# Patient Record
Sex: Male | Born: 1975 | Hispanic: Yes | Marital: Married | Smoking: Never smoker
Health system: Southern US, Community
[De-identification: ages and names within clinical notes are randomized; demographics above are authoritative.]

---

## 2017-04-27 ENCOUNTER — Emergency Department
Admission: EM | Admit: 2017-04-27 | Discharge: 2017-04-27 | Disposition: A | Discharge status: Home / Self Care | Payer: Self-pay | Attending: Student in an Organized Health Care Education/Training Program | Admitting: Student in an Organized Health Care Education/Training Program

## 2017-04-27 ENCOUNTER — Emergency Department: Payer: Self-pay

## 2017-04-27 ENCOUNTER — Encounter: Payer: Self-pay | Admitting: Emergency Medicine

## 2017-04-27 DIAGNOSIS — T148XXA Other injury of unspecified body region, initial encounter: Secondary | ICD-10-CM

## 2017-04-27 DIAGNOSIS — W450XXA Nail entering through skin, initial encounter: Secondary | ICD-10-CM | POA: Insufficient documentation

## 2017-04-27 DIAGNOSIS — Y929 Unspecified place or not applicable: Secondary | ICD-10-CM | POA: Insufficient documentation

## 2017-04-27 DIAGNOSIS — Y998 Other external cause status: Secondary | ICD-10-CM | POA: Insufficient documentation

## 2017-04-27 DIAGNOSIS — Y9389 Activity, other specified: Secondary | ICD-10-CM | POA: Insufficient documentation

## 2017-04-27 DIAGNOSIS — S91332A Puncture wound without foreign body, left foot, initial encounter: Secondary | ICD-10-CM | POA: Insufficient documentation

## 2017-04-27 MED ORDER — IBUPROFEN 600 MG PO TABS: 600.0000 mg | ORAL_TABLET | Freq: Four times a day (QID) | ORAL | 0 refills | Status: AC | PRN

## 2017-04-27 MED ORDER — LEVOFLOXACIN 500 MG PO TABS: 500.0000 mg | ORAL_TABLET | Freq: Every day | ORAL | 0 refills | Status: AC

## 2017-04-27 MED ORDER — OXYCODONE-ACETAMINOPHEN 5-325 MG PO TABS
1.0000 | ORAL_TABLET | Freq: Once | ORAL | Status: AC
  Administered 2017-04-27: 1 via ORAL
  Filled 2017-04-27: qty 1

## 2017-04-27 NOTE — ED Notes (Signed)
Pt able to walk well with crutches. Pt ambulatory to wheelchair using crutches for discharge. Pt and significant other verbalized understanding of discharge instructions, prescriptions and follow-up care. Pt also verbalized understanding importance of following-up with manager re: worker's comp since we were unable to contact his employer. Pt significant other drove pt home so we did not have to hold him d/t administration of pain medication.

## 2017-04-27 NOTE — ED Triage Notes (Signed)
Patient to ER with c/o pain to left foot after stepping on nail today at approx 1300.

## 2017-04-27 NOTE — ED Notes (Signed)
Portable XR at bedside

## 2017-04-27 NOTE — ED Notes (Addendum)
Pt c/o pain in the center of the ball of his left foot s/p stepping on a nail around noon today. Bottom of left foot is red and swollen. Reports last tetanus shot was 4 years ago but not entirely sure. Pt states this occurred at work and would like to claim worker's comp.

## 2017-04-27 NOTE — ED Notes (Signed)
Unable to contact pt employer

## 2017-04-27 NOTE — ED Provider Notes (Signed)
[facility] Emergency Department Provider Note  ____________________________________________  Time seen: Approximately 11:01 PM  I have reviewed the triage vital signs and the nursing notes.   HISTORY  Chief Complaint Foot Pain    HPI Jonathan Rojas is a 41 y.o. male that presents to emergency department for evaluation of foot pain after stepping on a nail today. No additional injuries. Last tetanus shot was 4 years ago. No alleviating measures have been attempted. No numbness, tingling.   History reviewed. No pertinent past medical history.  There are no active problems to display for this patient.   History reviewed. No pertinent surgical history.  Prior to Admission medications   Medication Sig Start Date End Date Taking? Authorizing Provider  ibuprofen (ADVIL,MOTRIN) 600 MG tablet Take 1 tablet (600 mg total) by mouth every 6 (six) hours as needed. 04/27/17   Enid Derry, PA-C  levofloxacin (LEVAQUIN) 500 MG tablet Take 1 tablet (500 mg total) by mouth daily. 04/27/17 05/07/17  Enid Derry, PA-C    Allergies Patient has no known allergies.  No family history on file.  Social History Social History  Substance Use Topics  . Smoking status: Never Smoker  . Smokeless tobacco: Never Used  . Alcohol use No     Review of Systems  Constitutional: No fever/chills Cardiovascular: No chest pain. Respiratory: No SOB. Gastrointestinal: No abdominal pain.  No nausea, no vomiting.  Musculoskeletal: Positive for foot pain. Skin: Negative for rash, ecchymosis. Neurological: Negative for headaches, numbness or tingling   ____________________________________________   PHYSICAL EXAM:  VITAL SIGNS: ED Triage Vitals  Enc Vitals Group     BP 04/27/17 2045 (!) 109/95     Pulse Rate 04/27/17 2045 97     Resp 04/27/17 2045 18     Temp 04/27/17 2045 98.1 F (36.7 C)     Temp Source 04/27/17 2045 Oral     SpO2 04/27/17 2045 100 %     Weight  04/27/17 2045 180 lb (81.6 kg)     Height 04/27/17 2045 5\' 6"  (1.676 m)     Head Circumference --      Peak Flow --      Pain Score 04/27/17 2044 10     Pain Loc --      Pain Edu? --      Excl. in GC? --      Constitutional: Alert and oriented. Well appearing and in no acute distress. Eyes: Conjunctivae are normal. PERRL. EOMI. Head: Atraumatic. ENT:      Ears:      Nose: No congestion/rhinnorhea.      Mouth/Throat: Mucous membranes are moist.  Neck: No stridor.  Cardiovascular: Normal rate, regular rhythm.  Good peripheral circulation. Respiratory: Normal respiratory effort without tachypnea or retractions. Lungs CTAB. Good air entry to the bases with no decreased or absent breath sounds. Musculoskeletal: Full range of motion to all extremities. No gross deformities appreciated. Neurologic:  Normal speech and language. No gross focal neurologic deficits are appreciated.  Skin:  Skin is warm, dry. Puncture wound to bottom of left foot with mild surrounding erythema.   ____________________________________________   LABS (all labs ordered are listed, but only abnormal results are displayed)  Labs Reviewed - No data to display ____________________________________________  EKG   ____________________________________________  RADIOLOGY Lexine Baton, personally viewed and evaluated these images (plain radiographs) as part of my medical decision making, as well as reviewing the written report by the radiologist.  Dg Foot Complete Left  Result Date: 04/27/2017 CLINICAL DATA:  Left foot pain after stepping on a nail at 1300 hours. Pain is at the base of the third digit. EXAM: LEFT FOOT - COMPLETE 3+ VIEW COMPARISON:  None. FINDINGS: There is no evidence of fracture or dislocation. No radiopaque foreign body. No appreciated subcutaneous emphysema. There is no evidence of arthropathy or other focal bone abnormality. IMPRESSION: Negative for fracture, retained foreign body or  significant soft tissue swelling. Electronically Signed   By: Tollie Ethavid  Kwon M.D.   On: 04/27/2017 21:19    ____________________________________________    PROCEDURES  Procedure(s) performed:    Procedures    Medications  oxyCODONE-acetaminophen (PERCOCET/ROXICET) 5-325 MG per tablet 1 tablet (not administered)     ____________________________________________   INITIAL IMPRESSION / ASSESSMENT AND PLAN / ED COURSE  Pertinent labs & imaging results that were available during my care of the patient were reviewed by me and considered in my medical decision making (see chart for details).  Review of the [place] CSRS was performed in accordance of the [facility] prior to dispensing any controlled drugs.     Patient presented to the emergency department for evaluation of puncture wound. Vital signs and exam are reassuring. Patient soaked foot in saline and iodine with in ED. X-ray negative for acute bony abnormalities. Crutches were given. Patient will be discharged home with prescriptions for Levaquin and ibuprofen. Patient is to follow up with primary care as directed. Patient is given ED precautions to return to the ED for any worsening or new symptoms.     ____________________________________________  FINAL CLINICAL IMPRESSION(S) / ED DIAGNOSES  Final diagnoses:  Puncture wound      NEW MEDICATIONS STARTED DURING THIS VISIT:  New Prescriptions   IBUPROFEN (ADVIL,MOTRIN) 600 MG TABLET    Take 1 tablet (600 mg total) by mouth every 6 (six) hours as needed.   LEVOFLOXACIN (LEVAQUIN) 500 MG TABLET    Take 1 tablet (500 mg total) by mouth daily.        This chart was dictated using voice recognition software/Dragon. Despite best efforts to proofread, errors can occur which can change the meaning. Any change was purely unintentional.    Enid DerryWagner, Jakari Jacot, PA-C 04/27/17 2337    Willy Eddyobinson, Patrick, MD 04/27/17 [phone]

## 2019-01-01 IMAGING — DX DG FOOT COMPLETE 3+V*L*
3 series · 3 of 3 positions shown · non-contrast
Comparison: None.

CLINICAL DATA: Left foot pain after stepping on a nail at 9944
hours. Pain is at the base of the third digit.

EXAM:
LEFT FOOT - COMPLETE 3+ VIEW

[foot ap]
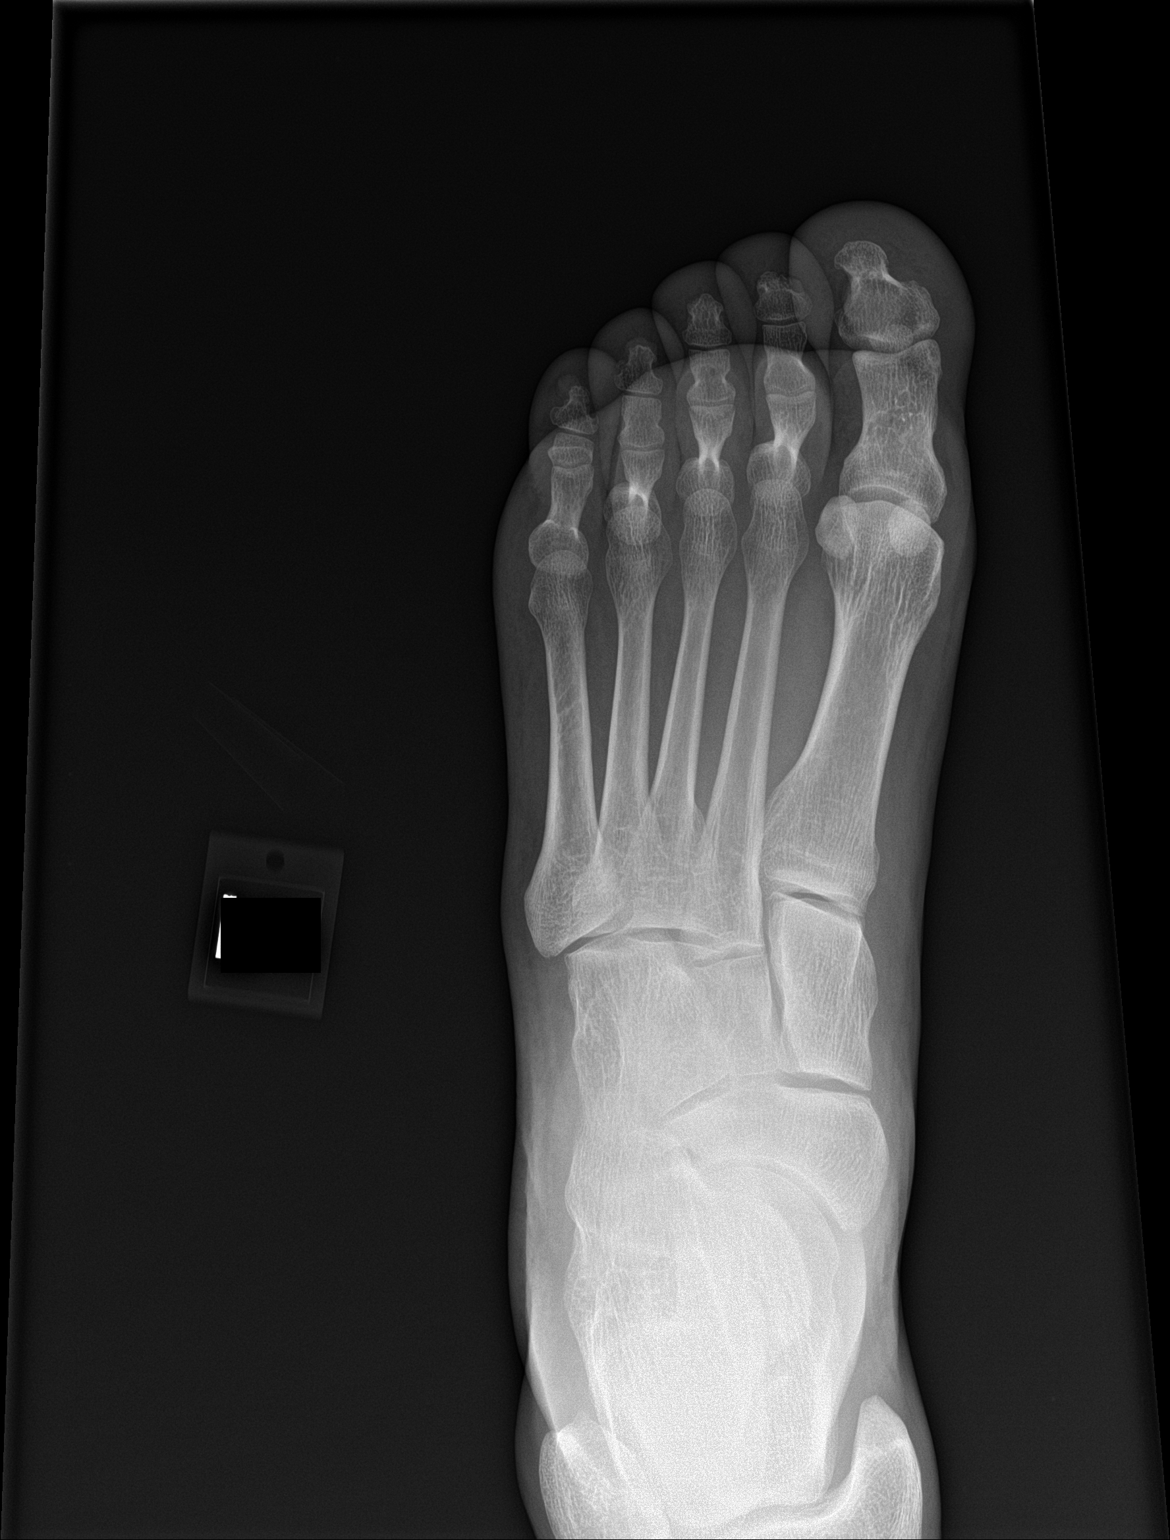

[foot obl]
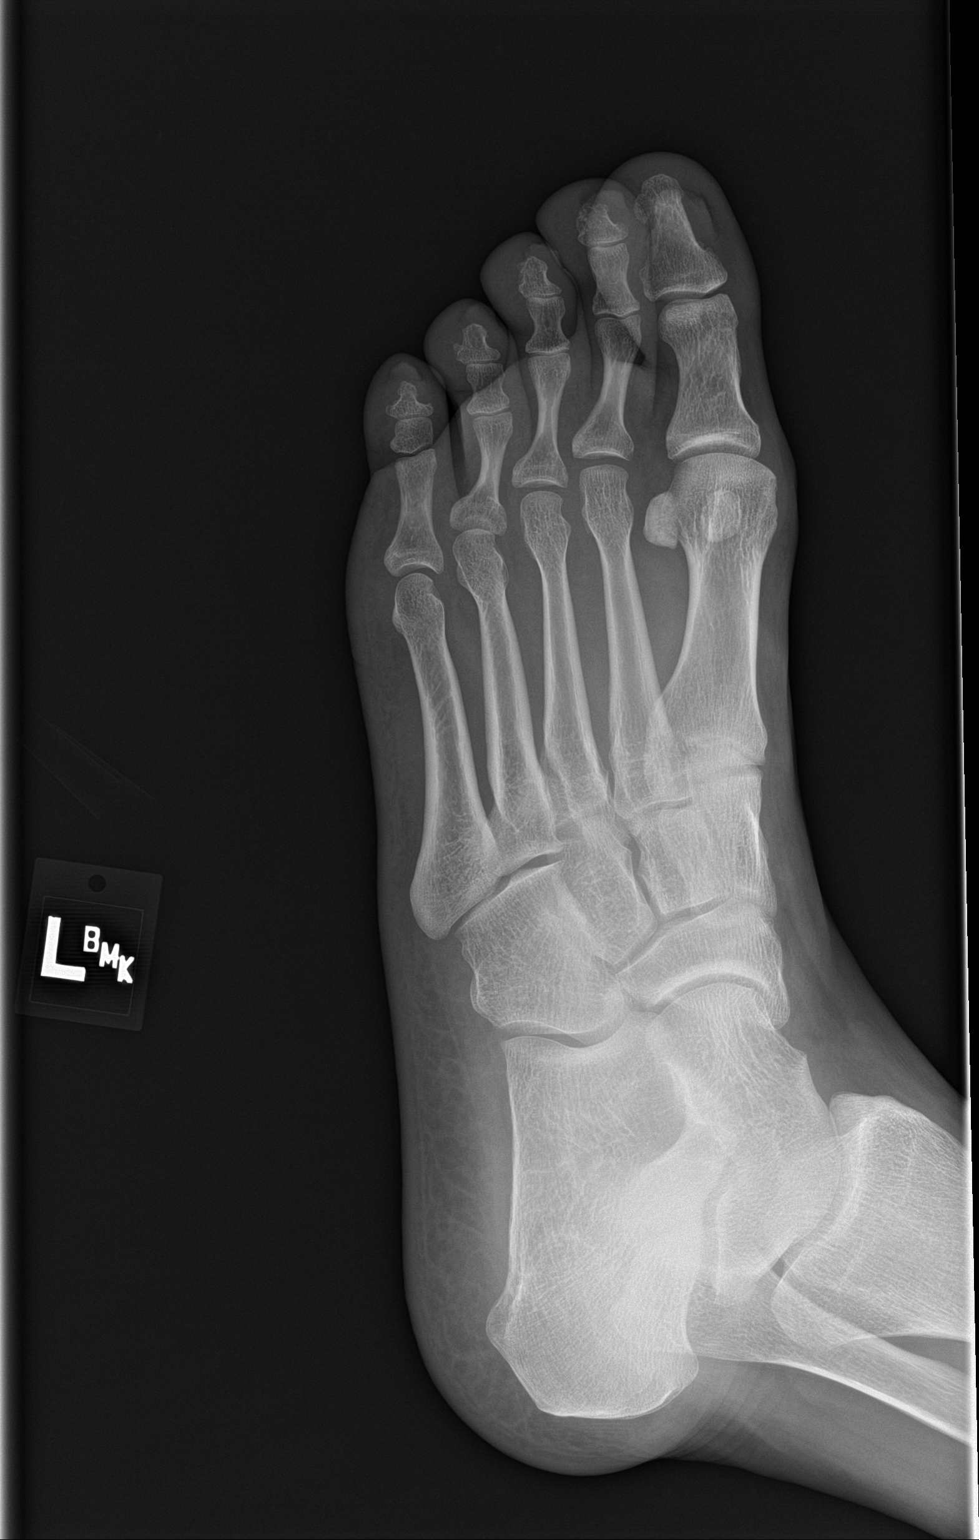

[foot lat]
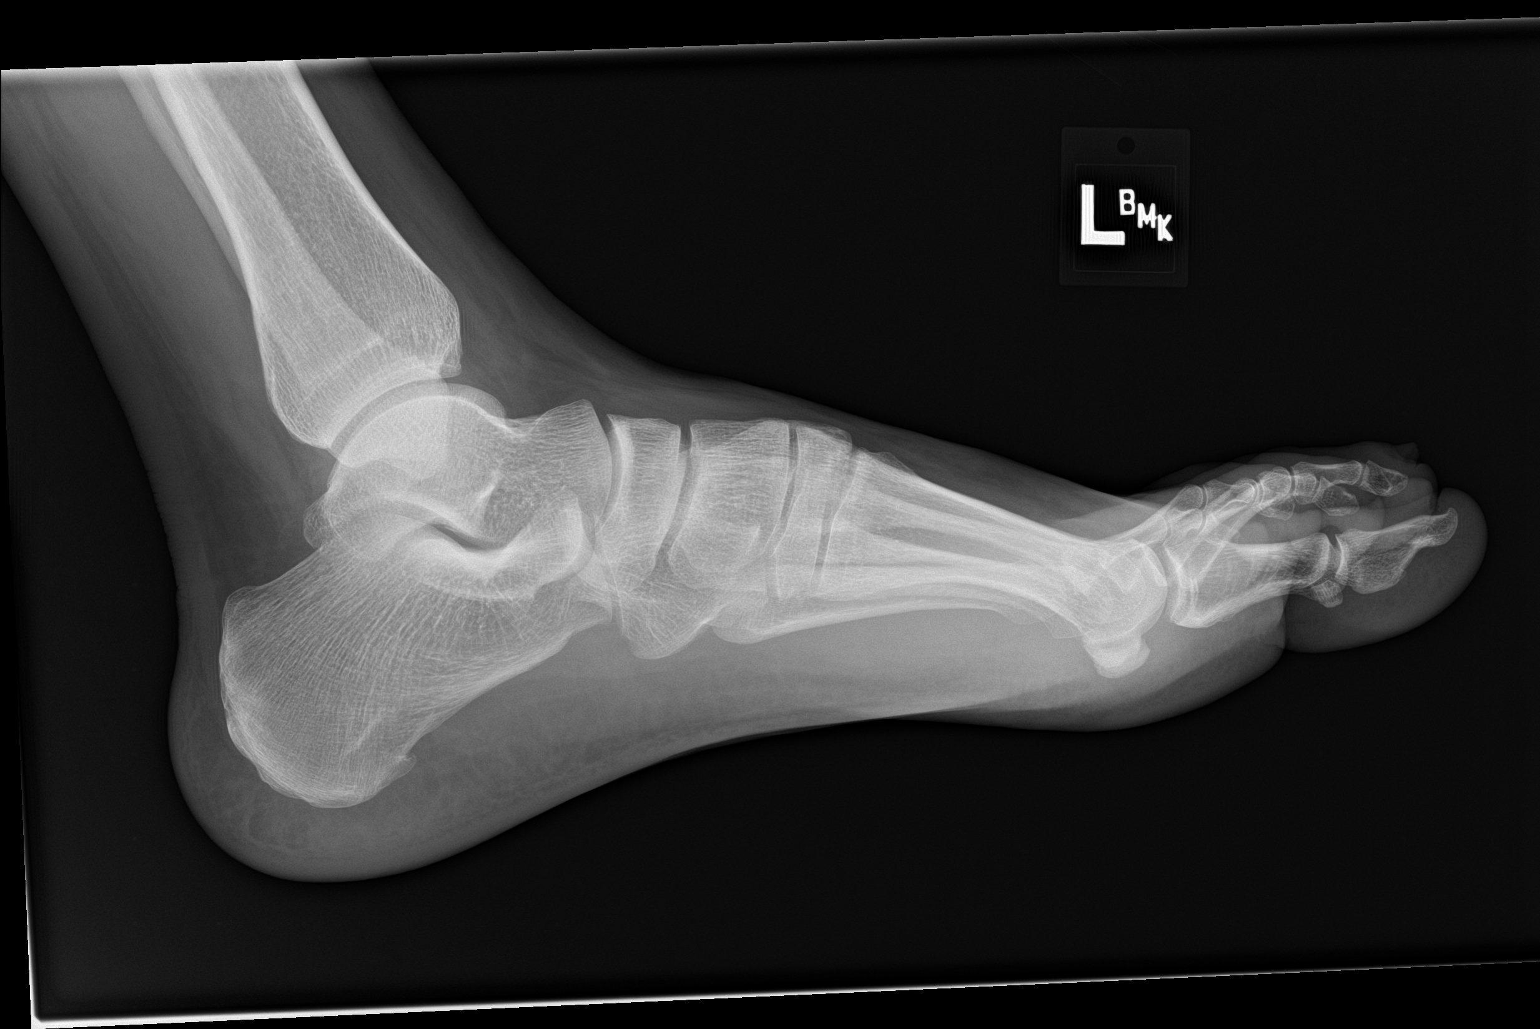

[3 of 3 positions shown; findings below may reference images not displayed]

FINDINGS: There is no evidence of fracture or dislocation. No radiopaque
foreign body. No appreciated subcutaneous emphysema. There is no
evidence of arthropathy or other focal bone abnormality.
IMPRESSION: Negative for fracture, retained foreign body or significant soft
tissue swelling.
